# Patient Record
Sex: Female | Born: 1980 | Race: White | Hispanic: No | Marital: Married | Smoking: Never smoker
Health system: Southern US, Community
[De-identification: ages and names within clinical notes are randomized; demographics above are authoritative.]

## PROBLEM LIST (undated history)

## (undated) HISTORY — PX: OTHER SURGICAL HISTORY: SHX169

## (undated) HISTORY — PX: ABDOMINAL HYSTERECTOMY: SHX81

## (undated) HISTORY — PX: TUBAL LIGATION: SHX77

---

## 2015-10-12 ENCOUNTER — Encounter (HOSPITAL_COMMUNITY): Payer: Self-pay

## 2015-10-12 ENCOUNTER — Emergency Department (HOSPITAL_COMMUNITY)
Admission: EM | Admit: 2015-10-12 | Discharge: 2015-10-12 | Disposition: A | Discharge status: Home / Self Care | Payer: BLUE CROSS/BLUE SHIELD | Attending: Emergency Medicine | Admitting: Emergency Medicine

## 2015-10-12 ENCOUNTER — Emergency Department (HOSPITAL_COMMUNITY): Payer: BLUE CROSS/BLUE SHIELD

## 2015-10-12 DIAGNOSIS — J069 Acute upper respiratory infection, unspecified: Secondary | ICD-10-CM | POA: Insufficient documentation

## 2015-10-12 DIAGNOSIS — R072 Precordial pain: Secondary | ICD-10-CM | POA: Diagnosis not present

## 2015-10-12 DIAGNOSIS — R079 Chest pain, unspecified: Secondary | ICD-10-CM

## 2015-10-12 LAB — COMPREHENSIVE METABOLIC PANEL
ALK PHOS: 56 U/L (ref 38–126)
ALT: 33 U/L (ref 14–54)
AST: 24 U/L (ref 15–41)
Albumin: 4.2 g/dL (ref 3.5–5.0)
Anion gap: 8 (ref 5–15)
BILIRUBIN TOTAL: 0.6 mg/dL (ref 0.3–1.2)
BUN: 9 mg/dL (ref 6–20)
CALCIUM: 9.2 mg/dL (ref 8.9–10.3)
CO2: 27 mmol/L (ref 22–32)
CREATININE: 0.73 mg/dL (ref 0.44–1.00)
Chloride: 105 mmol/L (ref 101–111)
GFR calc Af Amer: >60 mL/min (ref >60)
Glucose, Bld: 88 mg/dL (ref 65–99)
Potassium: 3.6 mmol/L (ref 3.5–5.1)
Sodium: 140 mmol/L (ref 135–145)
TOTAL PROTEIN: 8.4 g/dL — AB (ref 6.5–8.1)

## 2015-10-12 LAB — CBC WITH DIFFERENTIAL/PLATELET
BASOS ABS: 0 10*3/uL (ref 0.0–0.1)
Basophils Relative: 0 %
Eosinophils Absolute: 0.2 10*3/uL (ref 0.0–0.7)
Eosinophils Relative: 2 %
HEMATOCRIT: 44.6 % (ref 36.0–46.0)
Hemoglobin: 14.5 g/dL (ref 12.0–15.0)
LYMPHS ABS: 2.7 10*3/uL (ref 0.7–4.0)
LYMPHS PCT: 29 %
MCH: 29.4 pg (ref 26.0–34.0)
MCHC: 32.5 g/dL (ref 30.0–36.0)
MCV: 90.5 fL (ref 78.0–100.0)
Monocytes Absolute: 0.8 10*3/uL (ref 0.1–1.0)
Monocytes Relative: 8 %
NEUTROS ABS: 5.5 10*3/uL (ref 1.7–7.7)
Neutrophils Relative %: 61 %
Platelets: 406 10*3/uL — ABNORMAL HIGH (ref 150–400)
RBC: 4.93 MIL/uL (ref 3.87–5.11)
RDW: 13.1 % (ref 11.5–15.5)
WBC: 9.2 10*3/uL (ref 4.0–10.5)

## 2015-10-12 LAB — D-DIMER, QUANTITATIVE: D-Dimer, Quant: <0.27 ug/mL-FEU (ref 0.00–0.50)

## 2015-10-12 LAB — I-STAT TROPONIN, ED
Troponin i, poc: 0 ng/mL (ref 0.00–0.08)
Troponin i, poc: 0 ng/mL (ref 0.00–0.08)

## 2015-10-12 LAB — I-STAT BETA HCG BLOOD, ED (MC, WL, AP ONLY): I-stat hCG, quantitative: <5 m[IU]/mL (ref <5)

## 2015-10-12 LAB — LIPASE, BLOOD: LIPASE: 28 U/L (ref 11–51)

## 2015-10-12 MED ORDER — SODIUM CHLORIDE 0.9 % IV BOLUS (SEPSIS)
1000.0000 mL | Freq: Once | INTRAVENOUS | Status: AC
  Administered 2015-10-12: 1000 mL via INTRAVENOUS

## 2015-10-12 MED ORDER — ONDANSETRON HCL 4 MG/2ML IJ SOLN
4.0000 mg | Freq: Once | INTRAMUSCULAR | Status: DC
  Filled 2015-10-12: qty 2

## 2015-10-12 NOTE — ED Provider Notes (Signed)
[facility]-EMERGENCY DEPT Provider Note   CSN: [id number] Arrival date & time: 10/12/15  1418     History   Chief Complaint Chief Complaint  Patient presents with  . URI    HPI Nancy Pruitt is a 35 y.o. female With no significant past medical history who presents with one week of URI symptoms and two days of chest pain. Patient reports she has had fever for several days up to 101. She reports chills, congestion, rhinorrhea, sneezing, cough, fatigue, malaise, and diarrhea. She reports the diarrhea has persisted for one week. She says she feels dehydrated. Patient reports some shortness of breath and chest pain associated with her cough. She described the pain is not radiating, moderate, and pressure like. She denies hemoptysis, leg pain, leg swelling, and no DVT or PE history. She reports her father had a heart attack in his 30s. She is allergic to aspirin. She denies any other complaints upon arrival.    The history is provided by the patient, the spouse and medical records. No language interpreter was used.  URI   This is a new problem. The current episode started more than 2 days ago (1 week). The problem has been gradually worsening. The maximum temperature recorded prior to her arrival was 101 to 101.9 F. The fever has been present for 3 to 4 days (intermittnet). Associated symptoms include chest pain, diarrhea, congestion, rhinorrhea, sneezing and cough. Pertinent negatives include no abdominal pain, no nausea, no vomiting, no dysuria, no headaches, no neck pain and no wheezing. She has tried nothing for the symptoms. The treatment provided no relief.  Chest Pain   This is a new problem. The current episode started yesterday. The problem occurs constantly. The problem has not changed since onset.The pain is associated with coughing. The pain is present in the substernal region. The pain is moderate. The quality of the pain is described as pressure-like. The pain does not radiate. Associated  symptoms include cough, a fever, malaise/fatigue and shortness of breath. Pertinent negatives include no abdominal pain, no back pain, no diaphoresis, no dizziness, no exertional chest pressure, no headaches, no hemoptysis, no irregular heartbeat, no lower extremity edema, no nausea, no near-syncope, no numbness, no palpitations, no syncope, no vomiting and no weakness. She has tried nothing for the symptoms. The treatment provided no relief.  Her family medical history is significant for CAD and heart disease.    History reviewed. No pertinent past medical history.  There are no active problems to display for this patient.   Past Surgical History:  Procedure Laterality Date  . ABDOMINAL HYSTERECTOMY    . TUBAL LIGATION    . UTERINE ABLASION      OB History    No data available       Home Medications    Prior to Admission medications   Not on File    Family History History reviewed. No pertinent family history.  Social History Social History  Substance Use Topics  . Smoking status: Never Smoker  . Smokeless tobacco: Never Used  . Alcohol use No     Allergies   Doxycycline; Sulfa antibiotics; and Aspirin   Review of Systems Review of Systems  Constitutional: Positive for chills, fatigue, fever and malaise/fatigue. Negative for diaphoresis.  HENT: Positive for congestion, rhinorrhea and sneezing.   Eyes: Negative for visual disturbance.  Respiratory: Positive for cough and shortness of breath. Negative for hemoptysis, chest tightness, wheezing and stridor.   Cardiovascular: Positive for chest pain. Negative for palpitations,  syncope and near-syncope.  Gastrointestinal: Positive for diarrhea. Negative for abdominal pain, blood in stool, constipation, nausea and vomiting.  Genitourinary: Negative for dysuria, flank pain, frequency, hematuria, vaginal bleeding, vaginal discharge and vaginal pain.  Musculoskeletal: Negative for back pain and neck pain.  Skin: Negative  for wound.  Neurological: Negative for dizziness, weakness, numbness and headaches.  Psychiatric/Behavioral: Negative for agitation.  All other systems reviewed and are negative.    Physical Exam Updated Vital Signs BP 166/94 (BP Location: Left Arm)   Pulse 84   Temp 99.5 F (37.5 C) (Oral)   Resp 20   Ht 5\' 4"  (1.626 m)   Wt 205 lb (93 kg)   SpO2 100%   BMI 35.19 kg/m   Physical Exam  Constitutional: She is oriented to person, place, and time. She appears well-developed and well-nourished. No distress.  HENT:  Head: Normocephalic and atraumatic.  Mouth/Throat: Oropharynx is clear and moist. No oropharyngeal exudate.  Eyes: Conjunctivae and EOM are normal. Pupils are equal, round, and reactive to light.  Neck: Normal range of motion. Neck supple.  Cardiovascular: Normal rate, regular rhythm, normal heart sounds and intact distal pulses.   No murmur heard. Pulmonary/Chest: Effort normal and breath sounds normal. No stridor. No respiratory distress. She has no wheezes. She has no rales. She exhibits no tenderness.  Abdominal: Soft. Bowel sounds are normal. There is no tenderness.  Musculoskeletal: She exhibits no edema.  Neurological: She is alert and oriented to person, place, and time. No cranial nerve deficit. She exhibits normal muscle tone. Coordination normal.  Skin: Skin is warm and dry. Capillary refill takes less than 2 seconds. No rash noted.  Psychiatric: She has a normal mood and affect.  Nursing note and vitals reviewed.    ED Treatments / Results  Labs (all labs ordered are listed, but only abnormal results are displayed) Labs Reviewed  CBC WITH DIFFERENTIAL/PLATELET - Abnormal; Notable for the following:       Result Value   Platelets 406 (*)    All other components within normal limits  COMPREHENSIVE METABOLIC PANEL - Abnormal; Notable for the following:    Total Protein 8.4 (*)    All other components within normal limits  LIPASE, BLOOD  D-DIMER,  QUANTITATIVE (NOT AT [facility])  I-STAT TROPOININ, ED  I-STAT BETA HCG BLOOD, ED ([facility], [facility], [facility] ONLY)  I-STAT TROPOININ, ED    EKG  EKG Interpretation  Date/Time:  Friday October 12 2015 15:40:23 EDT Ventricular Rate:  75 PR Interval:  126 QRS Duration: 78 QT Interval:  384 QTC Calculation: 428 R Axis:   36 Text Interpretation:  Normal sinus rhythm Cannot rule out Anterior infarct , age undetermined Abnormal ECG possible [id number]  No Stemi Confirmed by Rush Landmark MD, CHRISTOPHER [phone]) on 10/12/2015 4:47:30 PM       Radiology Dg Chest 2 View  Result Date: 10/12/2015 CLINICAL DATA:  Cough for 5 days. EXAM: CHEST  2 VIEW COMPARISON:  None. FINDINGS: Minimal pectus excavatum deformity. Midline trachea. Normal heart size and mediastinal contours.Sharp costophrenic angles. No pneumothorax. Clear lungs. IMPRESSION: No active cardiopulmonary disease. Electronically Signed   By: Jeronimo Greaves M.D.   On: 10/12/2015 15:41    Procedures Procedures (including critical care time)  Medications Ordered in ED Medications  ondansetron (ZOFRAN) injection 4 mg (0 mg Intravenous Hold 10/12/15 1612)  sodium chloride 0.9 % bolus 1,000 mL (1,000 mLs Intravenous New Bag/Given 10/12/15 1612)     Initial Impression / Assessment and Plan / ED  Course  I have reviewed the triage vital signs and the nursing notes.  Pertinent labs & imaging results that were available during my care of the patient were reviewed by me and considered in my medical decision making (see chart for details).  Clinical Course    Nancy Pruitt is a 35 y.o. female With no significant past medical history who presents with one week of URI symptoms and two days of chest pain.   History and exam are seen above.  Given patient's constellation of symptoms, suspected viral URI infection as etiology. However, with the patient's family history of early in my and her pressure like chest pain, patient will be worked up for other causes of chest  pain.  EKG showed no acute signs of ischemia. Chest x-ray showed no abnormalities. Pregnancy test negative, D dimer nonelevated, lipase normal. No evidence of leukocytosis, anemia, or significant electrolyte abnormality.  Initial troponin negative.  HEART score was a three. Patient was counsel that given her low score, patient will be appropriate for discharge with outpatient management for low-risk chest pain Which may include provocative testing. She was informed that her risk for major adverse cardiac event was less than 1% with her negative troponins times two.  Patient likely has symptoms due to URI. Patient will follow up with PCP for further management of symptoms. Strict return precautions were given for new or worsening symptoms. Patient and family agreed with plan of care and patient was discharged in good condition.   Final Clinical Impressions(s) / ED Diagnoses   Final diagnoses:  Upper respiratory tract infection, unspecified type  Chest pain, unspecified type    New Prescriptions There are no discharge medications for this patient.   Clinical Impression: 1. Upper respiratory tract infection, unspecified type   2. Chest pain, unspecified type     Disposition: Discharge  Condition: Good  I have discussed the results, Dx and Tx plan with the pt(& family if present). He/she/they expressed understanding and agree(s) with the plan. Discharge instructions discussed at great length. Strict return precautions discussed and pt &/or family have verbalized understanding of the instructions. No further questions at time of discharge.    There are no discharge medications for this patient.   Follow Up: [facility]-EMERGENCY DEPT [address] [id number] mc [place] [address] [phone]  If symptoms worsen      Heide Scales, MD 10/12/15 2227

## 2015-10-12 NOTE — ED Triage Notes (Signed)
PT C/O FEVER, COUGH, BODY ACHES, SOB, DIZZINESS, NAUSEA,A ND DIARRHEA ON AND OFF SINCE Monday.

## 2015-10-12 NOTE — ED Provider Notes (Signed)
MSE was initiated and I personally evaluated the patient and placed orders (if any) at  3:17 PM on October 12, 2015.  Nancy Pruitt is a 35 y.o. female who presents to the Emergency Department complaining of sudden onset, constant, lightheadedness beginning today. She states she feels lightheaded and dizzy upon standing. She reports SOB and states it feels like "an elephant is sitting on her chest." Pt reports she has not been feeling well for several days and notes a cough, fever, and myalgias as well as nausea and diarrhea. Pt denies headache or vision changes.  She states she can't stand up without feeling dizzy. States it's more of a lightheaded feeling but with some vertiginous sensation as well.   On exam, low grade temp 99.5, no meningismus, clear lung exam, HTN 172/109. Given these findings and complaints, pt was upgraded to a level 3 and moved to another room. Work up started, including CBC w/diff, CMP, Lipase, betaHCG, troponin, EKG, CXR, orthostatic VS, zofran, and fluids.   The patient appears stable so that the remainder of the MSE may be completed by another provider.   France RavensMercedes Camprubi-Soms, PA-C 10/12/15 1520    Heide Scaleshristopher J Tegeler, MD 10/12/15 2217

## 2015-10-12 NOTE — ED Provider Notes (Signed)
I received this patient in signout from Dr. Rush Landmarkegeler, who had evaluated her for URI sx assoc w/ CP and SOB. We were awaiting 2nd troponin. 2nd troponin negative. Pt's HEART score is <3 therefore she is safe for discharge. Dr. Rush Landmarkegeler discussed discharge instructions w/ patient.   Laurence Spatesachel Morgan Little, MD 10/12/15 [phone]

## 2016-01-11 DIAGNOSIS — J019 Acute sinusitis, unspecified: Secondary | ICD-10-CM | POA: Diagnosis not present

## 2016-01-11 DIAGNOSIS — R030 Elevated blood-pressure reading, without diagnosis of hypertension: Secondary | ICD-10-CM | POA: Diagnosis not present

## 2016-02-04 DIAGNOSIS — R238 Other skin changes: Secondary | ICD-10-CM | POA: Diagnosis not present

## 2016-02-04 DIAGNOSIS — S29019A Strain of muscle and tendon of unspecified wall of thorax, initial encounter: Secondary | ICD-10-CM | POA: Diagnosis not present

## 2016-07-16 DIAGNOSIS — M7732 Calcaneal spur, left foot: Secondary | ICD-10-CM | POA: Diagnosis not present

## 2016-07-16 DIAGNOSIS — S96912A Strain of unspecified muscle and tendon at ankle and foot level, left foot, initial encounter: Secondary | ICD-10-CM | POA: Diagnosis not present

## 2016-07-16 DIAGNOSIS — M25572 Pain in left ankle and joints of left foot: Secondary | ICD-10-CM | POA: Diagnosis not present

## 2016-08-27 DIAGNOSIS — R1031 Right lower quadrant pain: Secondary | ICD-10-CM | POA: Diagnosis not present

## 2016-08-27 DIAGNOSIS — R309 Painful micturition, unspecified: Secondary | ICD-10-CM | POA: Diagnosis not present

## 2016-08-27 DIAGNOSIS — K760 Fatty (change of) liver, not elsewhere classified: Secondary | ICD-10-CM | POA: Diagnosis not present

## 2016-09-25 DIAGNOSIS — R030 Elevated blood-pressure reading, without diagnosis of hypertension: Secondary | ICD-10-CM | POA: Diagnosis not present

## 2016-09-25 DIAGNOSIS — R232 Flushing: Secondary | ICD-10-CM | POA: Diagnosis not present

## 2016-09-25 DIAGNOSIS — E049 Nontoxic goiter, unspecified: Secondary | ICD-10-CM | POA: Diagnosis not present

## 2016-10-09 DIAGNOSIS — I10 Essential (primary) hypertension: Secondary | ICD-10-CM | POA: Diagnosis not present

## 2016-10-09 DIAGNOSIS — E049 Nontoxic goiter, unspecified: Secondary | ICD-10-CM | POA: Diagnosis not present

## 2016-10-09 DIAGNOSIS — Z23 Encounter for immunization: Secondary | ICD-10-CM | POA: Diagnosis not present

## 2016-10-20 DIAGNOSIS — E049 Nontoxic goiter, unspecified: Secondary | ICD-10-CM | POA: Diagnosis not present

## 2017-01-07 DIAGNOSIS — R509 Fever, unspecified: Secondary | ICD-10-CM | POA: Diagnosis not present

## 2017-01-07 DIAGNOSIS — J029 Acute pharyngitis, unspecified: Secondary | ICD-10-CM | POA: Diagnosis not present

## 2017-03-26 DIAGNOSIS — I10 Essential (primary) hypertension: Secondary | ICD-10-CM | POA: Diagnosis not present

## 2017-03-26 DIAGNOSIS — K219 Gastro-esophageal reflux disease without esophagitis: Secondary | ICD-10-CM | POA: Diagnosis not present

## 2017-08-14 DIAGNOSIS — M549 Dorsalgia, unspecified: Secondary | ICD-10-CM | POA: Diagnosis not present

## 2017-08-14 DIAGNOSIS — M542 Cervicalgia: Secondary | ICD-10-CM | POA: Diagnosis not present

## 2017-09-21 ENCOUNTER — Encounter (HOSPITAL_BASED_OUTPATIENT_CLINIC_OR_DEPARTMENT_OTHER): Payer: Self-pay

## 2017-09-21 ENCOUNTER — Emergency Department (HOSPITAL_BASED_OUTPATIENT_CLINIC_OR_DEPARTMENT_OTHER)
Admission: EM | Admit: 2017-09-21 | Discharge: 2017-09-22 | Disposition: A | Discharge status: Home / Self Care | Payer: 59 | Attending: Emergency Medicine | Admitting: Emergency Medicine

## 2017-09-21 ENCOUNTER — Other Ambulatory Visit: Payer: Self-pay

## 2017-09-21 DIAGNOSIS — S0031XA Abrasion of nose, initial encounter: Secondary | ICD-10-CM | POA: Diagnosis not present

## 2017-09-21 DIAGNOSIS — W540XXA Bitten by dog, initial encounter: Secondary | ICD-10-CM | POA: Diagnosis not present

## 2017-09-21 DIAGNOSIS — Y9383 Activity, rough housing and horseplay: Secondary | ICD-10-CM | POA: Insufficient documentation

## 2017-09-21 DIAGNOSIS — S0185XA Open bite of other part of head, initial encounter: Secondary | ICD-10-CM

## 2017-09-21 DIAGNOSIS — Y999 Unspecified external cause status: Secondary | ICD-10-CM | POA: Diagnosis not present

## 2017-09-21 DIAGNOSIS — S0081XA Abrasion of other part of head, initial encounter: Secondary | ICD-10-CM

## 2017-09-21 DIAGNOSIS — Z23 Encounter for immunization: Secondary | ICD-10-CM | POA: Diagnosis not present

## 2017-09-21 DIAGNOSIS — Y929 Unspecified place or not applicable: Secondary | ICD-10-CM | POA: Diagnosis not present

## 2017-09-21 MED ORDER — AMOXICILLIN-POT CLAVULANATE 875-125 MG PO TABS
1.0000 | ORAL_TABLET | Freq: Once | ORAL | Status: AC
  Administered 2017-09-21: 1 via ORAL
  Filled 2017-09-21: qty 1

## 2017-09-21 MED ORDER — AMOXICILLIN-POT CLAVULANATE 875-125 MG PO TABS: 1.0000 | ORAL_TABLET | Freq: Two times a day (BID) | ORAL | 0 refills | Status: AC

## 2017-09-21 MED ORDER — TETANUS-DIPHTH-ACELL PERTUSSIS 5-2.5-18.5 LF-MCG/0.5 IM SUSP
0.5000 mL | Freq: Once | INTRAMUSCULAR | Status: AC
  Administered 2017-09-21: 0.5 mL via INTRAMUSCULAR
  Filled 2017-09-21: qty 0.5

## 2017-09-21 MED ORDER — IBUPROFEN 800 MG PO TABS
800.0000 mg | ORAL_TABLET | Freq: Once | ORAL | Status: AC
  Administered 2017-09-21: 800 mg via ORAL
  Filled 2017-09-21: qty 1

## 2017-09-21 NOTE — ED Provider Notes (Signed)
[facility] EMERGENCY DEPARTMENT Provider Note  CSN: [id number] Arrival date & time: 09/21/17 2219  Chief Complaint(s) Animal Bite  HPI Nancy Pruitt is a 37 y.o. female who presents to the emergency department for dog bite to the nose that occurred 2 hours prior to arrival.  The dog is the patient's pet and is up-to-date on vaccinations.  This was in the setting of roughhousing.  There was no aggression.  Patient endorses nasal bridge pain and sustained superficial abrasions to the nasal bridge.  Tenderness to palpation is aching and throbbing in nature.  Exacerbated with palpation of the nose.  No alleviating pain.  No epistaxis, eye injury, headache.  Denies any other injuries or complaints.  HPI  Past Medical History History reviewed. No pertinent past medical history. There are no active problems to display for this patient.  Home Medication(s) Prior to Admission medications   Medication Sig Start Date End Date Taking? Authorizing Provider  amoxicillin-clavulanate (AUGMENTIN) 875-125 MG tablet Take 1 tablet by mouth 2 (two) times daily for 5 days. 09/21/17 09/26/17  Nira Conn, MD                                                                                                                                    Past Surgical History Past Surgical History:  Procedure Laterality Date  . ABDOMINAL HYSTERECTOMY    . TUBAL LIGATION     Family History No family history on file.  Social History Social History   Tobacco Use  . Smoking status: Never Smoker  . Smokeless tobacco: Never Used  Substance Use Topics  . Alcohol use: Not Currently  . Drug use: Never   Allergies Aspirin; Doxycycline; and Sulfa antibiotics  Review of Systems Review of Systems As noted in HPI Physical Exam Vital Signs  I have reviewed the triage vital signs BP (!) 155/98 (BP Location: Left Arm)   Pulse 82   Temp 98.3 F (36.8 C) (Oral)   Resp 20   Ht 5\' 4"  (1.626 m)   Wt  108.2 kg   SpO2 98%   BMI 40.95 kg/m   Physical Exam  Constitutional: She is oriented to person, place, and time. She appears well-developed and well-nourished. No distress.  HENT:  Head: Normocephalic. Head is with abrasion. Head is without raccoon's eyes and without Battle's sign.    Right Ear: External ear normal.  Left Ear: External ear normal.  Nose: Nose normal.  No epistaxis, nasal septal hematoma.  Nasal bridge is tender to palpation.   Eyes: Conjunctivae and EOM are normal. No scleral icterus.  Neck: Normal range of motion and phonation normal.  Cardiovascular: Normal rate and regular rhythm.  Pulmonary/Chest: Effort normal. No stridor. No respiratory distress.  Abdominal: She exhibits no distension.  Musculoskeletal: Normal range of motion. She exhibits no edema.  Neurological: She is alert and oriented to person, place, and time.  Skin: She is  not diaphoretic.  Psychiatric: She has a normal mood and affect. Her behavior is normal.  Vitals reviewed.   ED Results and Treatments Labs (all labs ordered are listed, but only abnormal results are displayed) Labs Reviewed - No data to display                                                                                                                       EKG  EKG Interpretation  Date/Time:    Ventricular Rate:    PR Interval:    QRS Duration:   QT Interval:    QTC Calculation:   R Axis:     Text Interpretation:        Radiology No results found. Pertinent labs & imaging results that were available during my care of the patient were reviewed by me and considered in my medical decision making (see chart for details).  Medications Ordered in ED Medications  Tdap (BOOSTRIX) injection 0.5 mL (0.5 mLs Intramuscular Given 09/21/17 2326)  ibuprofen (ADVIL,MOTRIN) tablet 800 mg (800 mg Oral Given 09/21/17 2326)  amoxicillin-clavulanate (AUGMENTIN) 875-125 MG per tablet 1 tablet (1 tablet Oral Given 09/21/17 2326)                                                                                                                                     Procedures Procedures  (including critical care time)  Medical Decision Making / ED Course I have reviewed the nursing notes for this encounter and the patient's prior records (if available in EHR or on provided paperwork).    Superficial abrasions from dog bite to the nose bridge.  No other injuries noted on exam.  No imaging necessary.  Wounds irrigated.  Will allow for secondary closure.  tetanus updated.  Provided with oral pain medicine and first dose of Augmentin.  Will DC with prophylactic Augmentin.  The patient appears reasonably screened and/or stabilized for discharge and I doubt any other medical condition or other [facility] requiring further screening, evaluation, or treatment in the ED at this time prior to discharge.  The patient is safe for discharge with strict return precautions.   Final Clinical Impression(s) / ED Diagnoses Final diagnoses:  Dog bite of face, initial encounter  Abrasion of face, initial encounter    Disposition: Discharge  Condition: Good  I have discussed the results, Dx and Tx plan with the patient who expressed understanding and  agree(s) with the plan. Discharge instructions discussed at great length. The patient was given strict return precautions who verbalized understanding of the instructions. No further questions at time of discharge.    ED Discharge Orders         Ordered    amoxicillin-clavulanate (AUGMENTIN) 875-125 MG tablet  2 times daily     09/21/17 2353           Follow Up: Primary Care Provider  Schedule an appointment as soon as possible for a visit  As needed     This chart was dictated using voice recognition software.  Despite best efforts to proofread,  errors can occur which can change the documentation meaning.   Nira Connardama, Kasson Lamere Eduardo, MD 09/22/17 [phone]

## 2017-09-21 NOTE — ED Triage Notes (Addendum)
Pt states her dog bit her nose approx 45 min PTA-scratch/abrasions noted-dog's rabies UTD-NAD-steady gait

## 2017-09-22 ENCOUNTER — Encounter (HOSPITAL_COMMUNITY): Payer: Self-pay

## 2017-09-24 DIAGNOSIS — I10 Essential (primary) hypertension: Secondary | ICD-10-CM | POA: Diagnosis not present

## 2017-09-24 DIAGNOSIS — F5101 Primary insomnia: Secondary | ICD-10-CM | POA: Diagnosis not present

## 2017-10-27 DIAGNOSIS — F5101 Primary insomnia: Secondary | ICD-10-CM | POA: Diagnosis not present

## 2017-11-16 DIAGNOSIS — J069 Acute upper respiratory infection, unspecified: Secondary | ICD-10-CM | POA: Diagnosis not present

## 2018-03-03 IMAGING — CR DG CHEST 2V
2 series · 2 of 2 positions shown · non-contrast
Comparison: None.

CLINICAL DATA: Cough for 5 days.

EXAM:
CHEST  2 VIEW

[w chest pa]
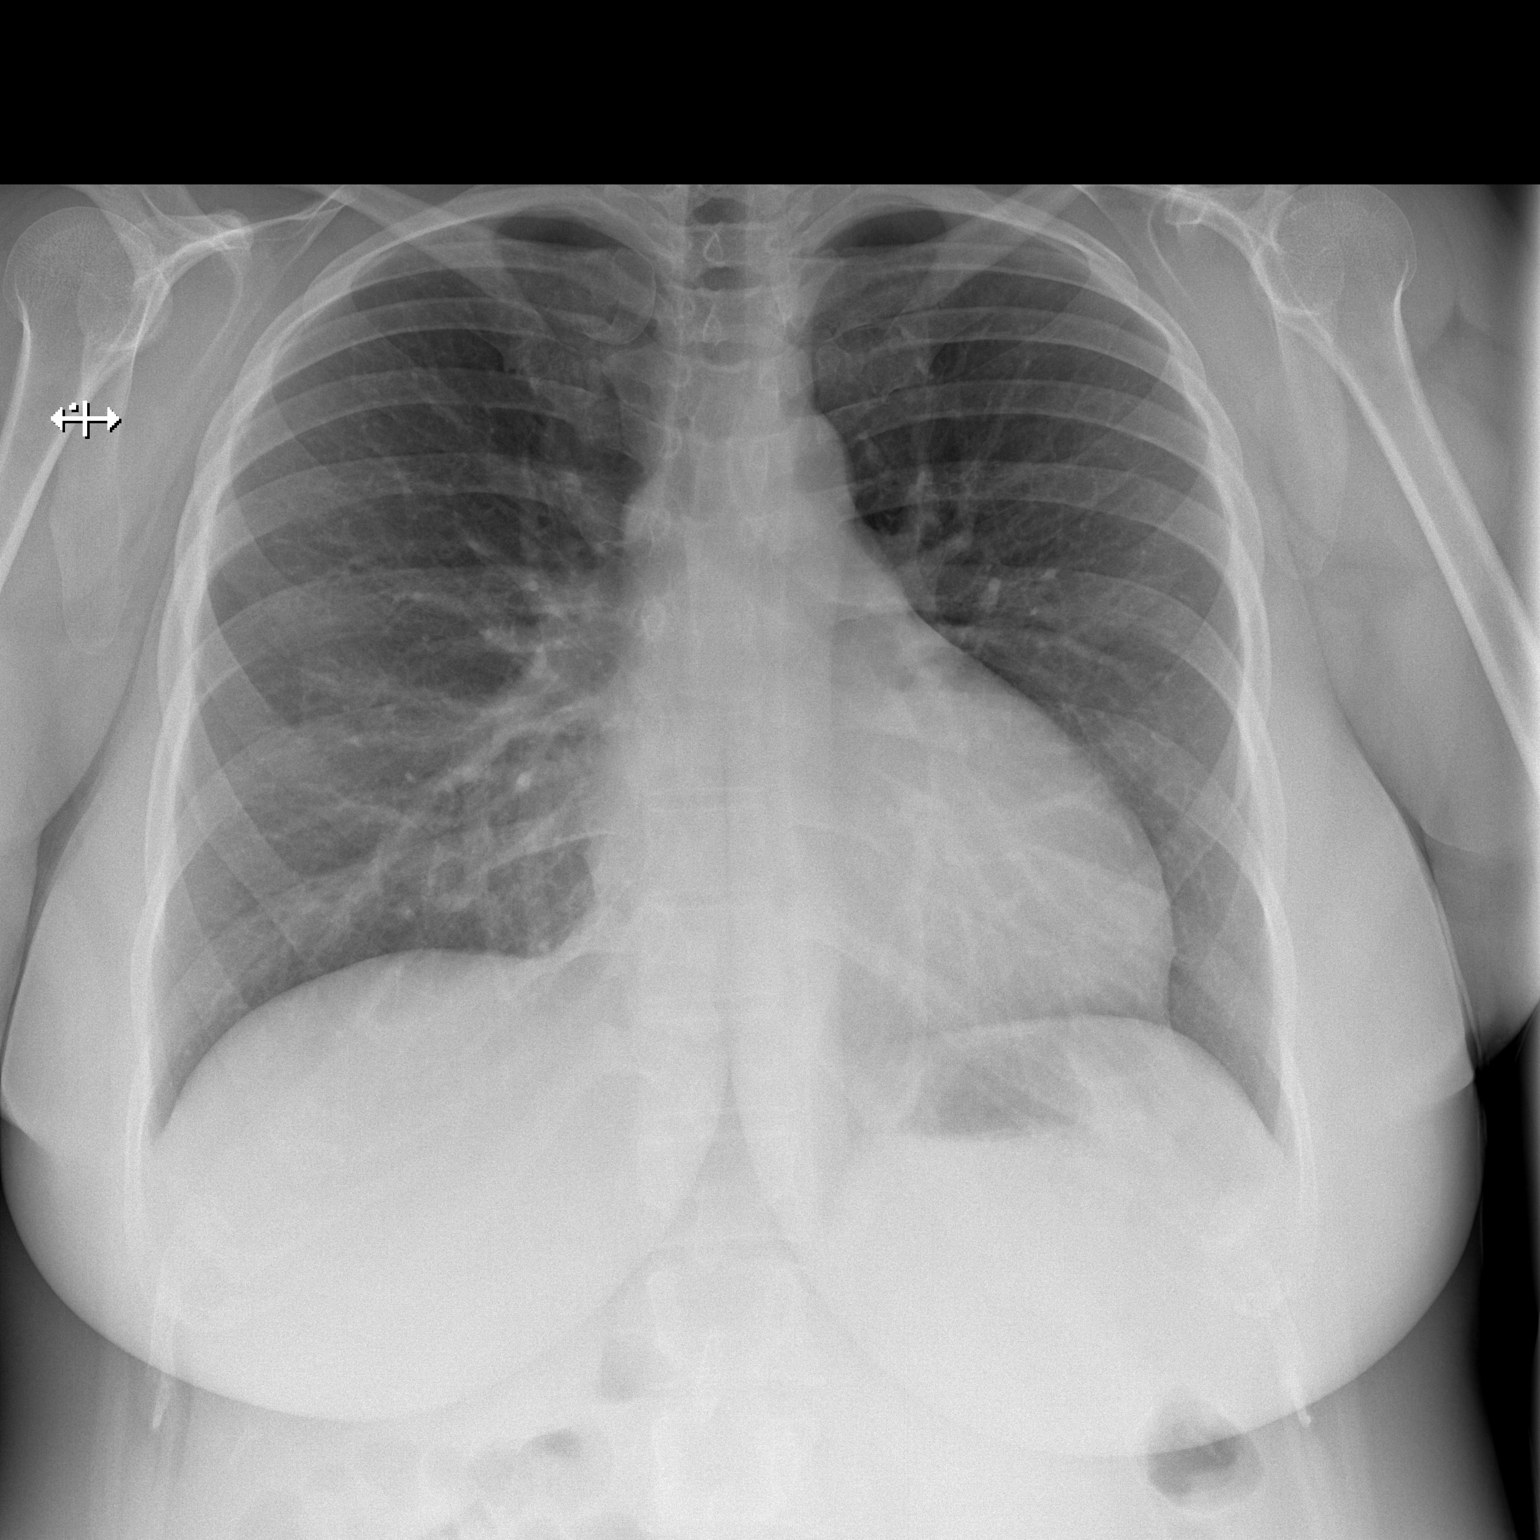

[w chest lat]
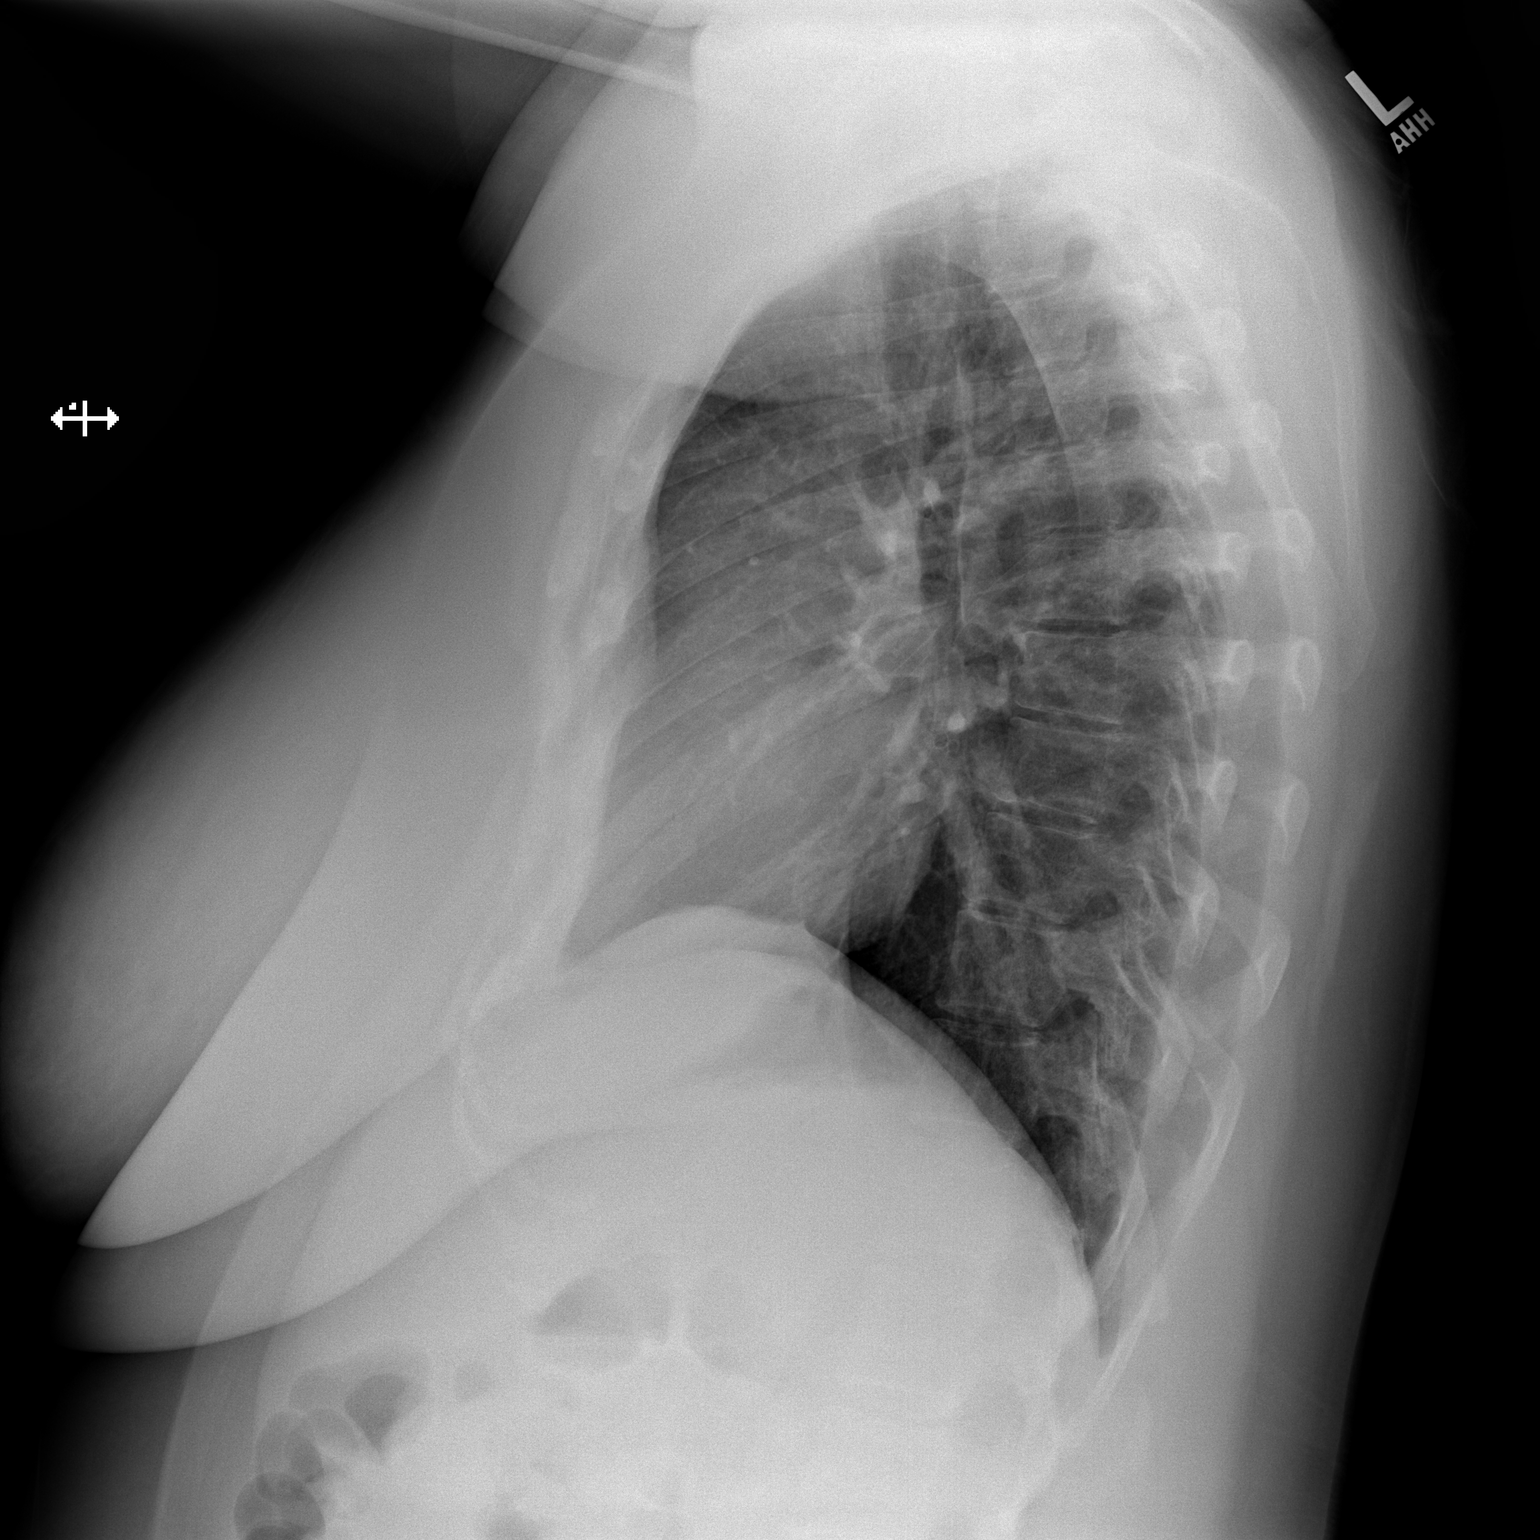

[2 of 2 positions shown; findings below may reference images not displayed]

FINDINGS: Minimal pectus excavatum deformity. Midline trachea. Normal heart
size and mediastinal contours.Sharp costophrenic angles. No
pneumothorax. Clear lungs.
IMPRESSION: No active cardiopulmonary disease.

## 2018-03-15 DIAGNOSIS — H6692 Otitis media, unspecified, left ear: Secondary | ICD-10-CM | POA: Diagnosis not present

## 2018-06-01 DIAGNOSIS — R509 Fever, unspecified: Secondary | ICD-10-CM | POA: Diagnosis not present

## 2018-06-01 DIAGNOSIS — R05 Cough: Secondary | ICD-10-CM | POA: Diagnosis not present

## 2018-06-01 DIAGNOSIS — R197 Diarrhea, unspecified: Secondary | ICD-10-CM | POA: Diagnosis not present

## 2018-06-01 DIAGNOSIS — R52 Pain, unspecified: Secondary | ICD-10-CM | POA: Diagnosis not present

## 2018-06-22 DIAGNOSIS — Z13220 Encounter for screening for lipoid disorders: Secondary | ICD-10-CM | POA: Diagnosis not present

## 2018-06-22 DIAGNOSIS — K581 Irritable bowel syndrome with constipation: Secondary | ICD-10-CM | POA: Diagnosis not present

## 2018-06-22 DIAGNOSIS — R0683 Snoring: Secondary | ICD-10-CM | POA: Diagnosis not present

## 2018-06-22 DIAGNOSIS — I10 Essential (primary) hypertension: Secondary | ICD-10-CM | POA: Diagnosis not present

## 2018-06-22 DIAGNOSIS — F5101 Primary insomnia: Secondary | ICD-10-CM | POA: Diagnosis not present

## 2018-06-22 DIAGNOSIS — R5383 Other fatigue: Secondary | ICD-10-CM | POA: Diagnosis not present

## 2018-06-22 DIAGNOSIS — Z0000 Encounter for general adult medical examination without abnormal findings: Secondary | ICD-10-CM | POA: Diagnosis not present

## 2018-06-22 DIAGNOSIS — Z6830 Body mass index (BMI) 30.0-30.9, adult: Secondary | ICD-10-CM | POA: Diagnosis not present

## 2018-06-22 DIAGNOSIS — R7303 Prediabetes: Secondary | ICD-10-CM | POA: Diagnosis not present

## 2018-07-15 DIAGNOSIS — G4733 Obstructive sleep apnea (adult) (pediatric): Secondary | ICD-10-CM | POA: Diagnosis not present

## 2018-08-03 DIAGNOSIS — G4733 Obstructive sleep apnea (adult) (pediatric): Secondary | ICD-10-CM | POA: Diagnosis not present

## 2018-09-03 DIAGNOSIS — G4733 Obstructive sleep apnea (adult) (pediatric): Secondary | ICD-10-CM | POA: Diagnosis not present

## 2018-10-04 DIAGNOSIS — G4733 Obstructive sleep apnea (adult) (pediatric): Secondary | ICD-10-CM | POA: Diagnosis not present

## 2019-07-22 ENCOUNTER — Other Ambulatory Visit: Payer: Self-pay | Admitting: Family Medicine

## 2019-07-22 DIAGNOSIS — Z1231 Encounter for screening mammogram for malignant neoplasm of breast: Secondary | ICD-10-CM

## 2019-09-02 ENCOUNTER — Other Ambulatory Visit: Payer: Self-pay

## 2019-09-02 ENCOUNTER — Ambulatory Visit
Admission: RE | Admit: 2019-09-02 | Discharge: 2019-09-02 | Disposition: A | Discharge status: Home / Self Care | Payer: 59 | Source: Ambulatory Visit | Attending: Family Medicine | Admitting: Family Medicine

## 2019-09-02 DIAGNOSIS — Z1231 Encounter for screening mammogram for malignant neoplasm of breast: Secondary | ICD-10-CM

## 2020-02-01 ENCOUNTER — Other Ambulatory Visit: Payer: Self-pay | Admitting: Family Medicine

## 2020-02-01 DIAGNOSIS — I10 Essential (primary) hypertension: Secondary | ICD-10-CM | POA: Diagnosis not present

## 2020-02-01 DIAGNOSIS — K219 Gastro-esophageal reflux disease without esophagitis: Secondary | ICD-10-CM | POA: Diagnosis not present

## 2020-02-01 DIAGNOSIS — E2839 Other primary ovarian failure: Secondary | ICD-10-CM

## 2020-02-01 DIAGNOSIS — R7303 Prediabetes: Secondary | ICD-10-CM | POA: Diagnosis not present

## 2020-02-09 DIAGNOSIS — R61 Generalized hyperhidrosis: Secondary | ICD-10-CM | POA: Diagnosis not present

## 2020-02-09 DIAGNOSIS — I10 Essential (primary) hypertension: Secondary | ICD-10-CM | POA: Diagnosis not present

## 2020-02-09 DIAGNOSIS — E2839 Other primary ovarian failure: Secondary | ICD-10-CM | POA: Diagnosis not present

## 2020-02-09 DIAGNOSIS — R7303 Prediabetes: Secondary | ICD-10-CM | POA: Diagnosis not present

## 2020-02-28 DIAGNOSIS — F4323 Adjustment disorder with mixed anxiety and depressed mood: Secondary | ICD-10-CM | POA: Diagnosis not present

## 2020-03-12 DIAGNOSIS — F4323 Adjustment disorder with mixed anxiety and depressed mood: Secondary | ICD-10-CM | POA: Diagnosis not present

## 2020-03-28 DIAGNOSIS — F4323 Adjustment disorder with mixed anxiety and depressed mood: Secondary | ICD-10-CM | POA: Diagnosis not present

## 2020-04-04 DIAGNOSIS — R2689 Other abnormalities of gait and mobility: Secondary | ICD-10-CM | POA: Diagnosis not present

## 2020-04-04 DIAGNOSIS — M7662 Achilles tendinitis, left leg: Secondary | ICD-10-CM | POA: Diagnosis not present

## 2020-04-04 DIAGNOSIS — R609 Edema, unspecified: Secondary | ICD-10-CM | POA: Diagnosis not present

## 2020-04-04 DIAGNOSIS — M722 Plantar fascial fibromatosis: Secondary | ICD-10-CM | POA: Diagnosis not present

## 2020-05-06 DIAGNOSIS — J0100 Acute maxillary sinusitis, unspecified: Secondary | ICD-10-CM | POA: Diagnosis not present

## 2020-05-16 DIAGNOSIS — B370 Candidal stomatitis: Secondary | ICD-10-CM | POA: Diagnosis not present

## 2020-05-16 DIAGNOSIS — J3489 Other specified disorders of nose and nasal sinuses: Secondary | ICD-10-CM | POA: Diagnosis not present

## 2020-06-12 ENCOUNTER — Other Ambulatory Visit: Payer: Self-pay

## 2020-06-12 ENCOUNTER — Ambulatory Visit
Admission: RE | Admit: 2020-06-12 | Discharge: 2020-06-12 | Disposition: A | Discharge status: Home / Self Care | Payer: 59 | Source: Ambulatory Visit | Attending: Family Medicine | Admitting: Family Medicine

## 2020-06-12 DIAGNOSIS — E2839 Other primary ovarian failure: Secondary | ICD-10-CM

## 2020-06-12 DIAGNOSIS — Z0389 Encounter for observation for other suspected diseases and conditions ruled out: Secondary | ICD-10-CM | POA: Diagnosis not present

## 2020-06-18 DIAGNOSIS — M7732 Calcaneal spur, left foot: Secondary | ICD-10-CM | POA: Diagnosis not present

## 2020-06-18 DIAGNOSIS — M7662 Achilles tendinitis, left leg: Secondary | ICD-10-CM | POA: Diagnosis not present

## 2020-06-27 DIAGNOSIS — Z0000 Encounter for general adult medical examination without abnormal findings: Secondary | ICD-10-CM | POA: Diagnosis not present

## 2020-06-27 DIAGNOSIS — R7303 Prediabetes: Secondary | ICD-10-CM | POA: Diagnosis not present

## 2020-06-27 DIAGNOSIS — F411 Generalized anxiety disorder: Secondary | ICD-10-CM | POA: Diagnosis not present

## 2020-06-27 DIAGNOSIS — K760 Fatty (change of) liver, not elsewhere classified: Secondary | ICD-10-CM | POA: Diagnosis not present

## 2020-06-27 DIAGNOSIS — I10 Essential (primary) hypertension: Secondary | ICD-10-CM | POA: Diagnosis not present

## 2020-06-27 DIAGNOSIS — E559 Vitamin D deficiency, unspecified: Secondary | ICD-10-CM | POA: Diagnosis not present

## 2020-06-27 DIAGNOSIS — K219 Gastro-esophageal reflux disease without esophagitis: Secondary | ICD-10-CM | POA: Diagnosis not present

## 2020-06-27 DIAGNOSIS — E782 Mixed hyperlipidemia: Secondary | ICD-10-CM | POA: Diagnosis not present

## 2020-07-25 ENCOUNTER — Other Ambulatory Visit: Payer: Self-pay | Admitting: Family Medicine

## 2020-07-25 DIAGNOSIS — Z1231 Encounter for screening mammogram for malignant neoplasm of breast: Secondary | ICD-10-CM

## 2020-09-17 ENCOUNTER — Other Ambulatory Visit: Payer: Self-pay

## 2020-09-17 ENCOUNTER — Ambulatory Visit
Admission: RE | Admit: 2020-09-17 | Discharge: 2020-09-17 | Disposition: A | Discharge status: Home / Self Care | Payer: BC Managed Care – PPO | Source: Ambulatory Visit

## 2020-09-17 DIAGNOSIS — Z1231 Encounter for screening mammogram for malignant neoplasm of breast: Secondary | ICD-10-CM

## 2020-12-19 DIAGNOSIS — F5101 Primary insomnia: Secondary | ICD-10-CM | POA: Diagnosis not present

## 2020-12-19 DIAGNOSIS — I10 Essential (primary) hypertension: Secondary | ICD-10-CM | POA: Diagnosis not present

## 2020-12-19 DIAGNOSIS — B370 Candidal stomatitis: Secondary | ICD-10-CM | POA: Diagnosis not present

## 2020-12-19 DIAGNOSIS — R7303 Prediabetes: Secondary | ICD-10-CM | POA: Diagnosis not present

## 2020-12-26 DIAGNOSIS — R7303 Prediabetes: Secondary | ICD-10-CM | POA: Diagnosis not present

## 2020-12-26 DIAGNOSIS — I10 Essential (primary) hypertension: Secondary | ICD-10-CM | POA: Diagnosis not present

## 2021-06-17 DIAGNOSIS — R21 Rash and other nonspecific skin eruption: Secondary | ICD-10-CM | POA: Diagnosis not present

## 2021-06-17 DIAGNOSIS — R599 Enlarged lymph nodes, unspecified: Secondary | ICD-10-CM | POA: Diagnosis not present

## 2021-07-01 DIAGNOSIS — Z0000 Encounter for general adult medical examination without abnormal findings: Secondary | ICD-10-CM | POA: Diagnosis not present

## 2021-07-01 DIAGNOSIS — I10 Essential (primary) hypertension: Secondary | ICD-10-CM | POA: Diagnosis not present

## 2021-07-01 DIAGNOSIS — K760 Fatty (change of) liver, not elsewhere classified: Secondary | ICD-10-CM | POA: Diagnosis not present

## 2021-07-01 DIAGNOSIS — E559 Vitamin D deficiency, unspecified: Secondary | ICD-10-CM | POA: Diagnosis not present

## 2021-07-01 DIAGNOSIS — R7303 Prediabetes: Secondary | ICD-10-CM | POA: Diagnosis not present

## 2021-07-01 DIAGNOSIS — E782 Mixed hyperlipidemia: Secondary | ICD-10-CM | POA: Diagnosis not present

## 2021-08-16 ENCOUNTER — Other Ambulatory Visit: Payer: Self-pay | Admitting: Family Medicine

## 2021-08-16 DIAGNOSIS — Z1231 Encounter for screening mammogram for malignant neoplasm of breast: Secondary | ICD-10-CM

## 2021-09-18 ENCOUNTER — Ambulatory Visit
Admission: RE | Admit: 2021-09-18 | Discharge: 2021-09-18 | Disposition: A | Discharge status: Home / Self Care | Payer: BC Managed Care – PPO | Source: Ambulatory Visit

## 2021-09-18 DIAGNOSIS — Z1231 Encounter for screening mammogram for malignant neoplasm of breast: Secondary | ICD-10-CM | POA: Diagnosis not present

## 2021-12-31 DIAGNOSIS — R7303 Prediabetes: Secondary | ICD-10-CM | POA: Diagnosis not present

## 2021-12-31 DIAGNOSIS — J320 Chronic maxillary sinusitis: Secondary | ICD-10-CM | POA: Diagnosis not present

## 2021-12-31 DIAGNOSIS — I10 Essential (primary) hypertension: Secondary | ICD-10-CM | POA: Diagnosis not present

## 2021-12-31 DIAGNOSIS — K760 Fatty (change of) liver, not elsewhere classified: Secondary | ICD-10-CM | POA: Diagnosis not present

## 2022-02-17 DIAGNOSIS — N3001 Acute cystitis with hematuria: Secondary | ICD-10-CM | POA: Diagnosis not present

## 2022-02-17 DIAGNOSIS — R399 Unspecified symptoms and signs involving the genitourinary system: Secondary | ICD-10-CM | POA: Diagnosis not present

## 2022-02-17 DIAGNOSIS — R300 Dysuria: Secondary | ICD-10-CM | POA: Diagnosis not present

## 2022-03-14 DIAGNOSIS — R059 Cough, unspecified: Secondary | ICD-10-CM | POA: Diagnosis not present

## 2022-03-14 DIAGNOSIS — B974 Respiratory syncytial virus as the cause of diseases classified elsewhere: Secondary | ICD-10-CM | POA: Diagnosis not present

## 2022-03-14 DIAGNOSIS — R0981 Nasal congestion: Secondary | ICD-10-CM | POA: Diagnosis not present

## 2022-03-24 DIAGNOSIS — R7303 Prediabetes: Secondary | ICD-10-CM | POA: Diagnosis not present

## 2022-03-24 DIAGNOSIS — T753XXA Motion sickness, initial encounter: Secondary | ICD-10-CM | POA: Diagnosis not present

## 2022-03-24 DIAGNOSIS — I10 Essential (primary) hypertension: Secondary | ICD-10-CM | POA: Diagnosis not present

## 2022-04-26 DIAGNOSIS — N390 Urinary tract infection, site not specified: Secondary | ICD-10-CM | POA: Diagnosis not present

## 2022-06-18 DIAGNOSIS — R0981 Nasal congestion: Secondary | ICD-10-CM | POA: Diagnosis not present

## 2022-06-18 DIAGNOSIS — J019 Acute sinusitis, unspecified: Secondary | ICD-10-CM | POA: Diagnosis not present

## 2022-07-17 DIAGNOSIS — D473 Essential (hemorrhagic) thrombocythemia: Secondary | ICD-10-CM | POA: Diagnosis not present

## 2022-07-17 DIAGNOSIS — K760 Fatty (change of) liver, not elsewhere classified: Secondary | ICD-10-CM | POA: Diagnosis not present

## 2022-07-17 DIAGNOSIS — F5101 Primary insomnia: Secondary | ICD-10-CM | POA: Diagnosis not present

## 2022-07-17 DIAGNOSIS — E559 Vitamin D deficiency, unspecified: Secondary | ICD-10-CM | POA: Diagnosis not present

## 2022-07-17 DIAGNOSIS — E782 Mixed hyperlipidemia: Secondary | ICD-10-CM | POA: Diagnosis not present

## 2022-07-17 DIAGNOSIS — E669 Obesity, unspecified: Secondary | ICD-10-CM | POA: Diagnosis not present

## 2022-07-17 DIAGNOSIS — I10 Essential (primary) hypertension: Secondary | ICD-10-CM | POA: Diagnosis not present

## 2022-07-17 DIAGNOSIS — K219 Gastro-esophageal reflux disease without esophagitis: Secondary | ICD-10-CM | POA: Diagnosis not present

## 2022-07-17 DIAGNOSIS — Z0000 Encounter for general adult medical examination without abnormal findings: Secondary | ICD-10-CM | POA: Diagnosis not present

## 2022-07-17 DIAGNOSIS — R7303 Prediabetes: Secondary | ICD-10-CM | POA: Diagnosis not present

## 2022-07-21 ENCOUNTER — Other Ambulatory Visit: Payer: Self-pay | Admitting: Family Medicine

## 2022-07-21 DIAGNOSIS — Z1231 Encounter for screening mammogram for malignant neoplasm of breast: Secondary | ICD-10-CM

## 2022-09-22 ENCOUNTER — Ambulatory Visit
Admission: RE | Admit: 2022-09-22 | Discharge: 2022-09-22 | Disposition: A | Discharge status: Home / Self Care | Payer: BC Managed Care – PPO | Source: Ambulatory Visit | Attending: Family Medicine | Admitting: Family Medicine

## 2022-09-22 DIAGNOSIS — Z1231 Encounter for screening mammogram for malignant neoplasm of breast: Secondary | ICD-10-CM

## 2022-09-30 DIAGNOSIS — R232 Flushing: Secondary | ICD-10-CM | POA: Diagnosis not present

## 2022-09-30 DIAGNOSIS — R61 Generalized hyperhidrosis: Secondary | ICD-10-CM | POA: Diagnosis not present

## 2022-09-30 DIAGNOSIS — R5383 Other fatigue: Secondary | ICD-10-CM | POA: Diagnosis not present

## 2022-09-30 DIAGNOSIS — F488 Other specified nonpsychotic mental disorders: Secondary | ICD-10-CM | POA: Diagnosis not present

## 2022-09-30 DIAGNOSIS — I10 Essential (primary) hypertension: Secondary | ICD-10-CM | POA: Diagnosis not present

## 2022-09-30 DIAGNOSIS — R7303 Prediabetes: Secondary | ICD-10-CM | POA: Diagnosis not present

## 2022-09-30 DIAGNOSIS — K219 Gastro-esophageal reflux disease without esophagitis: Secondary | ICD-10-CM | POA: Diagnosis not present

## 2022-12-27 DIAGNOSIS — N3000 Acute cystitis without hematuria: Secondary | ICD-10-CM | POA: Diagnosis not present

## 2022-12-28 DIAGNOSIS — R350 Frequency of micturition: Secondary | ICD-10-CM | POA: Diagnosis not present

## 2022-12-30 DIAGNOSIS — I10 Essential (primary) hypertension: Secondary | ICD-10-CM | POA: Diagnosis not present

## 2022-12-30 DIAGNOSIS — R7303 Prediabetes: Secondary | ICD-10-CM | POA: Diagnosis not present

## 2022-12-30 DIAGNOSIS — K760 Fatty (change of) liver, not elsewhere classified: Secondary | ICD-10-CM | POA: Diagnosis not present

## 2022-12-30 DIAGNOSIS — K219 Gastro-esophageal reflux disease without esophagitis: Secondary | ICD-10-CM | POA: Diagnosis not present

## 2022-12-30 DIAGNOSIS — R61 Generalized hyperhidrosis: Secondary | ICD-10-CM | POA: Diagnosis not present

## 2022-12-30 DIAGNOSIS — F5101 Primary insomnia: Secondary | ICD-10-CM | POA: Diagnosis not present

## 2022-12-30 DIAGNOSIS — F411 Generalized anxiety disorder: Secondary | ICD-10-CM | POA: Diagnosis not present

## 2023-01-15 DIAGNOSIS — N959 Unspecified menopausal and perimenopausal disorder: Secondary | ICD-10-CM | POA: Diagnosis not present

## 2023-01-15 DIAGNOSIS — N952 Postmenopausal atrophic vaginitis: Secondary | ICD-10-CM | POA: Diagnosis not present

## 2023-02-11 DIAGNOSIS — N952 Postmenopausal atrophic vaginitis: Secondary | ICD-10-CM | POA: Diagnosis not present

## 2023-02-11 DIAGNOSIS — N959 Unspecified menopausal and perimenopausal disorder: Secondary | ICD-10-CM | POA: Diagnosis not present

## 2023-06-23 DIAGNOSIS — N951 Menopausal and female climacteric states: Secondary | ICD-10-CM | POA: Diagnosis not present

## 2023-06-23 DIAGNOSIS — N952 Postmenopausal atrophic vaginitis: Secondary | ICD-10-CM | POA: Diagnosis not present

## 2023-07-30 DIAGNOSIS — E559 Vitamin D deficiency, unspecified: Secondary | ICD-10-CM | POA: Diagnosis not present

## 2023-07-30 DIAGNOSIS — D473 Essential (hemorrhagic) thrombocythemia: Secondary | ICD-10-CM | POA: Diagnosis not present

## 2023-07-30 DIAGNOSIS — E282 Polycystic ovarian syndrome: Secondary | ICD-10-CM | POA: Diagnosis not present

## 2023-07-30 DIAGNOSIS — Z0000 Encounter for general adult medical examination without abnormal findings: Secondary | ICD-10-CM | POA: Diagnosis not present

## 2023-07-30 DIAGNOSIS — I10 Essential (primary) hypertension: Secondary | ICD-10-CM | POA: Diagnosis not present

## 2023-07-30 DIAGNOSIS — F411 Generalized anxiety disorder: Secondary | ICD-10-CM | POA: Diagnosis not present

## 2023-07-30 DIAGNOSIS — K760 Fatty (change of) liver, not elsewhere classified: Secondary | ICD-10-CM | POA: Diagnosis not present

## 2023-07-30 DIAGNOSIS — E782 Mixed hyperlipidemia: Secondary | ICD-10-CM | POA: Diagnosis not present

## 2023-07-30 DIAGNOSIS — Z6827 Body mass index (BMI) 27.0-27.9, adult: Secondary | ICD-10-CM | POA: Diagnosis not present

## 2023-07-30 DIAGNOSIS — K219 Gastro-esophageal reflux disease without esophagitis: Secondary | ICD-10-CM | POA: Diagnosis not present

## 2023-08-14 DIAGNOSIS — R350 Frequency of micturition: Secondary | ICD-10-CM | POA: Diagnosis not present

## 2023-08-14 DIAGNOSIS — R300 Dysuria: Secondary | ICD-10-CM | POA: Diagnosis not present
